# Patient Record
Sex: Male | Born: 1990 | Race: Black or African American | Hispanic: No | Marital: Single | Smoking: Current every day smoker
Health system: Southern US, Community
[De-identification: ages and names within clinical notes are randomized; demographics above are authoritative.]

## PROBLEM LIST (undated history)

## (undated) ENCOUNTER — Emergency Department: Admission: EM | Payer: Self-pay

## (undated) DIAGNOSIS — K219 Gastro-esophageal reflux disease without esophagitis: Secondary | ICD-10-CM

## (undated) DIAGNOSIS — J45909 Unspecified asthma, uncomplicated: Secondary | ICD-10-CM

## (undated) HISTORY — DX: Unspecified asthma, uncomplicated: J45.909

## (undated) HISTORY — DX: Gastro-esophageal reflux disease without esophagitis: K21.9

## (undated) HISTORY — PX: KNEE SURGERY: SHX244

---
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
## 2011-08-19 ENCOUNTER — Emergency Department: Payer: Self-pay | Admitting: Emergency Medicine

## 2011-09-02 ENCOUNTER — Emergency Department: Payer: Self-pay | Admitting: Unknown Physician Specialty

## 2013-08-20 ENCOUNTER — Emergency Department: Payer: Self-pay | Admitting: Emergency Medicine

## 2014-05-28 ENCOUNTER — Emergency Department: Payer: Self-pay | Admitting: Emergency Medicine

## 2015-01-31 IMAGING — CT CT MAXILLOFACIAL WITHOUT CONTRAST
4 series · 17 of 47 positions shown, 19 images · non-contrast
Comparison: None.

CLINICAL DATA: Head trauma, softball injury. Neck and facial
soreness.

EXAM:
CT HEAD WITHOUT CONTRAST
CT MAXILLOFACIAL WITHOUT CONTRAST
TECHNIQUE: Multidetector CT imaging of the head and maxillofacial structures
were performed using the standard protocol without intravenous
contrast. Multiplanar CT image reconstructions of the maxillofacial
structures were also generated.

[Series 2: head wo · axial · 0.43mm/px · z∈[-103,+23]mm · 7 of 36 slices shown, 9 images]
[im 5/36  brain]
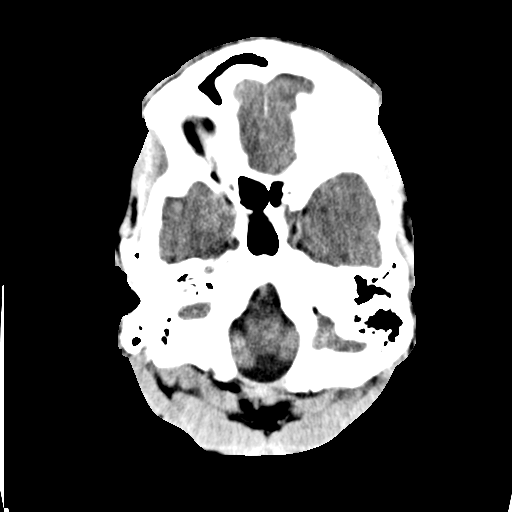
[im 5/36  bone]
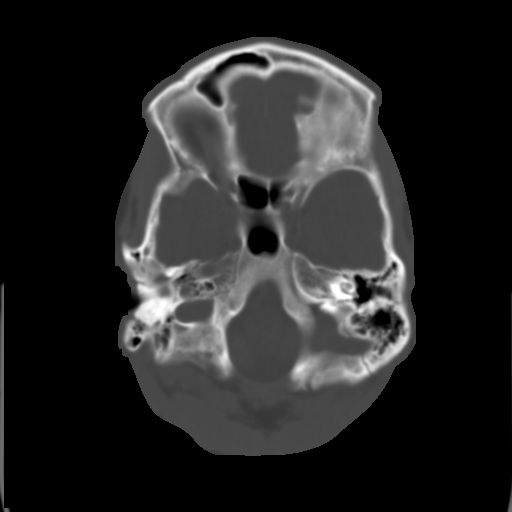
[im 9/36  bone]
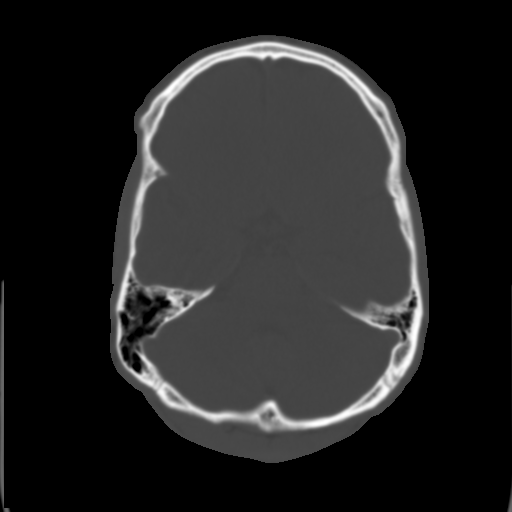
[im 14/36  bone]
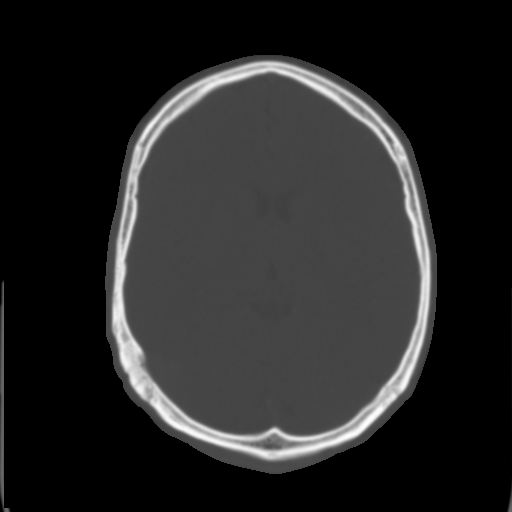
[im 18/36  bone]
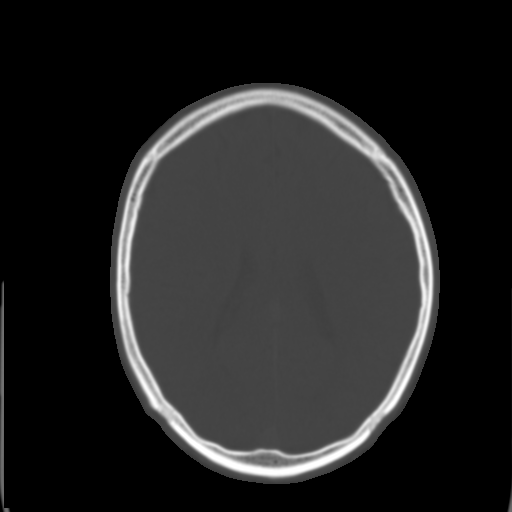
[im 22/36  brain]
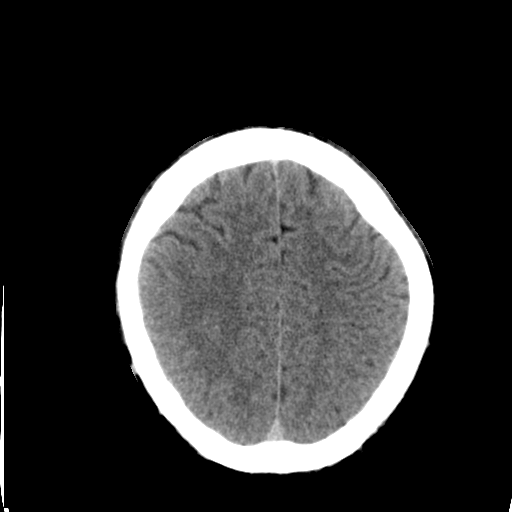
[im 22/36  bone]
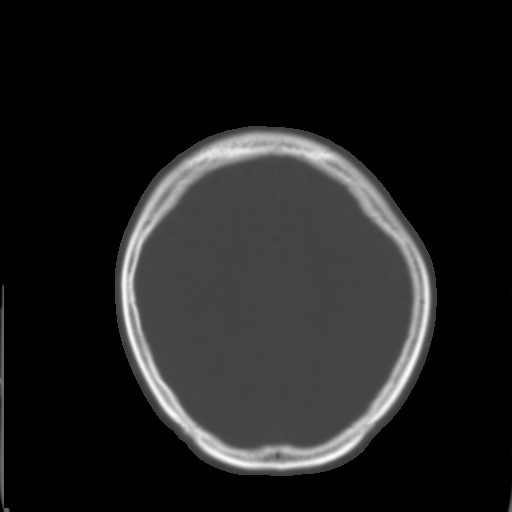
[im 27/36  bone]
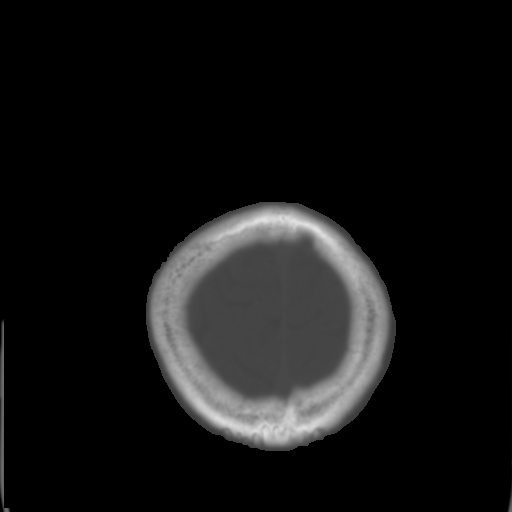
[im 31/36  bone]
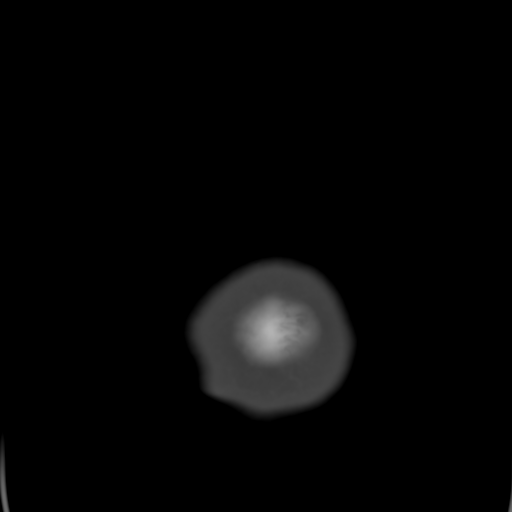

[Series 3: max soft · axial · 0.32mm/px · z∈[-230,-172]mm · 4 of 84 slices shown]
[im 9/84  brain]
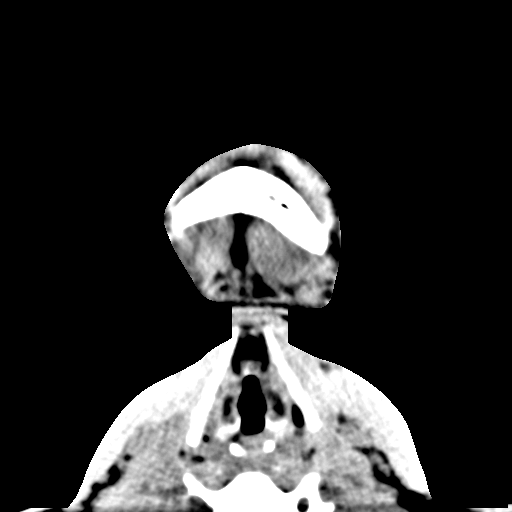
[im 17/84  brain]
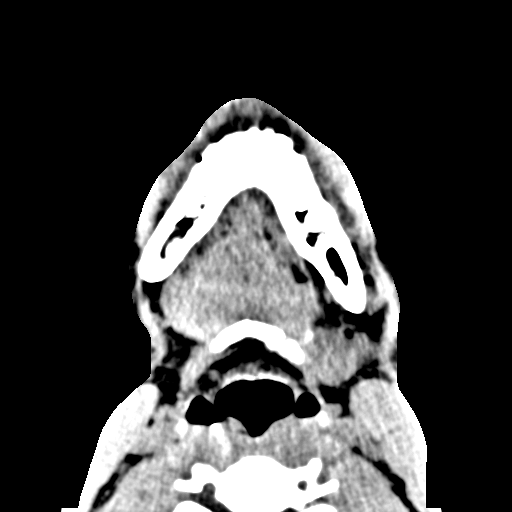
[im 25/84  brain]
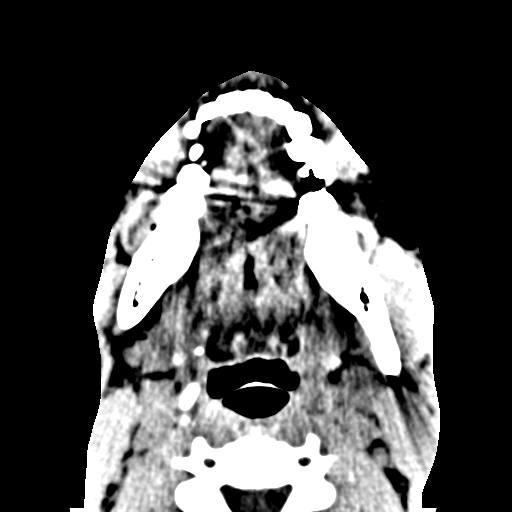
[im 38/84  brain]
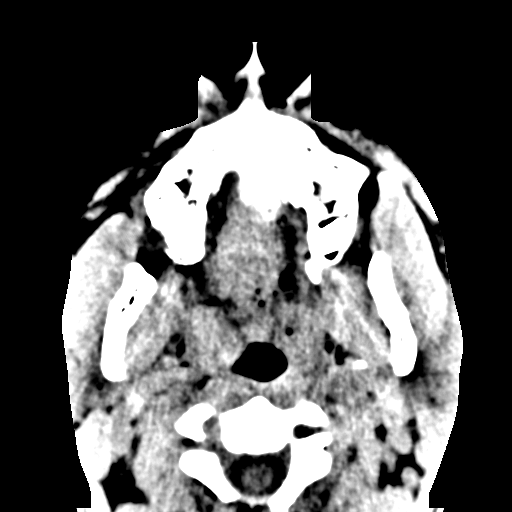

[Series 6: coronal soft · coronal · 0.31mm/px · 3 of 79 slices shown]
[im 27/79  bone]
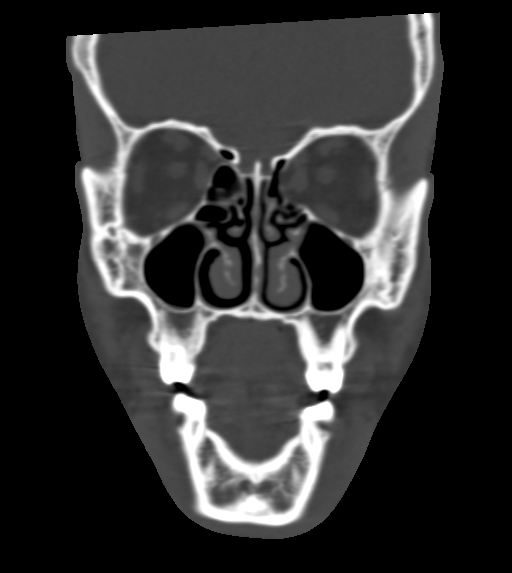
[im 35/79  bone]
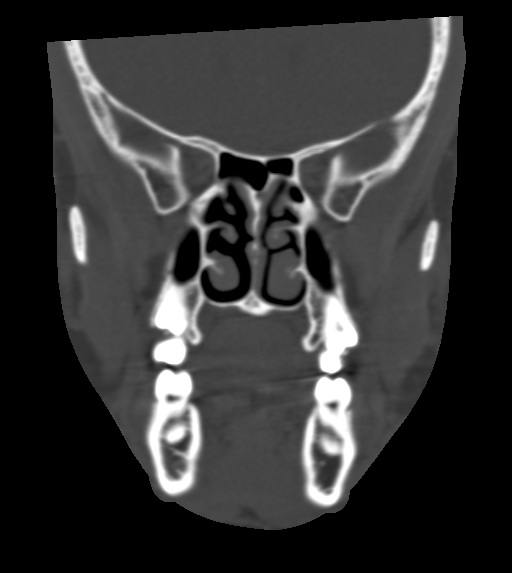
[im 44/79  bone]
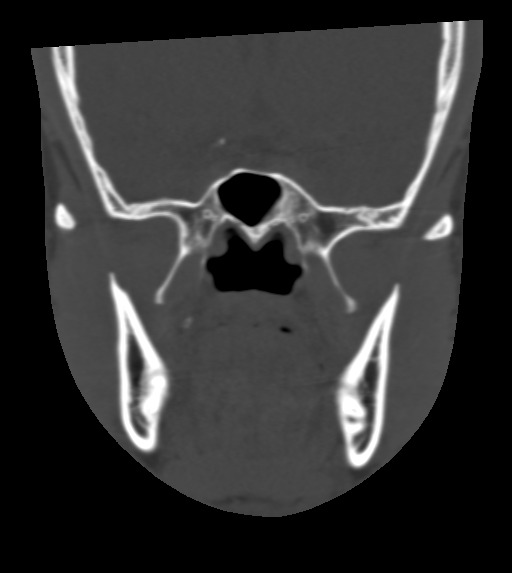

[Series 7: sagittal soft · sagittal · 0.35mm/px · 3 of 80 slices shown]
[im 27/80  bone]
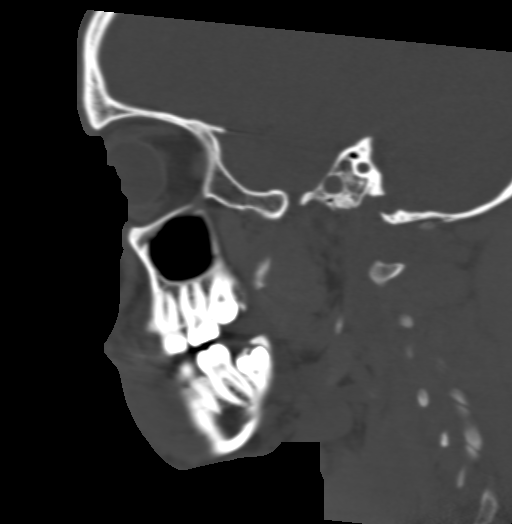
[im 40/80  bone]
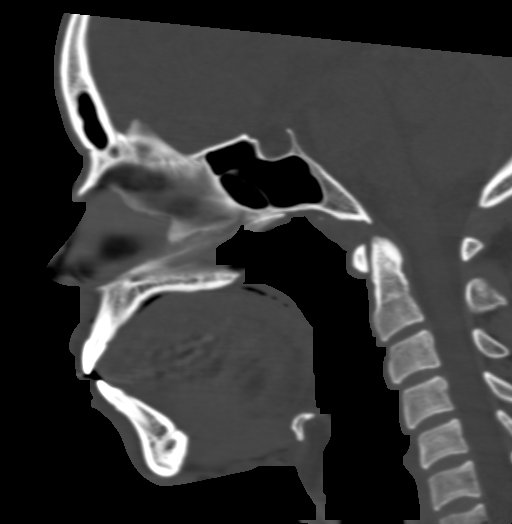
[im 53/80  bone]
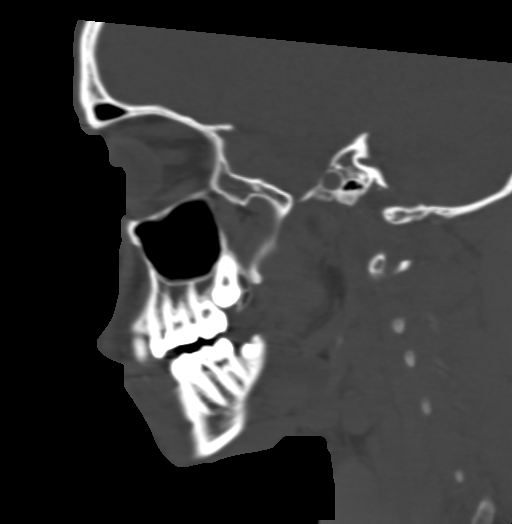

[17 of 47 positions shown; findings below may reference images not displayed]

FINDINGS: CT HEAD FINDINGS

The ventricles and sulci are normal. No intraparenchymal hemorrhage,
mass effect nor midline shift. No acute large vascular territory
infarcts.

No abnormal extra-axial fluid collections. Basal cisterns are
patent. No skull fracture.

CT MAXILLOFACIAL FINDINGS

The mandible is intact, the condyles are located. No acute facial
fracture.

Trace paranasal sinus mucosal thickening without air-fluid levels.
Nasal septum is midline. No destructive bony lesions.

Remote LEFT medial orbital blowout fracture. Soft tissues are
nonsuspicious.
IMPRESSION: CT HEAD: No acute intracranial process ; normal noncontrast CT of
the head.

CT MAXILLOFACIAL:  No acute facial fracture.

Remote LEFT medial orbital blowout fracture.

  By: Abudullahi Dammie

## 2015-05-01 ENCOUNTER — Emergency Department
Admission: EM | Admit: 2015-05-01 | Discharge: 2015-05-01 | Disposition: A | Discharge status: Home / Self Care | Payer: BLUE CROSS/BLUE SHIELD | Attending: Emergency Medicine | Admitting: Emergency Medicine

## 2015-05-01 ENCOUNTER — Encounter: Payer: Self-pay | Admitting: *Deleted

## 2015-05-01 DIAGNOSIS — Z720 Tobacco use: Secondary | ICD-10-CM | POA: Insufficient documentation

## 2015-05-01 DIAGNOSIS — K2970 Gastritis, unspecified, without bleeding: Secondary | ICD-10-CM | POA: Diagnosis not present

## 2015-05-01 DIAGNOSIS — R1010 Upper abdominal pain, unspecified: Secondary | ICD-10-CM | POA: Diagnosis present

## 2015-05-01 LAB — COMPREHENSIVE METABOLIC PANEL
ALK PHOS: 32 U/L — AB (ref 38–126)
ALT: 11 U/L — AB (ref 17–63)
AST: 20 U/L (ref 15–41)
Albumin: 4.1 g/dL (ref 3.5–5.0)
Anion gap: 8 (ref 5–15)
BUN: 6 mg/dL (ref 6–20)
CALCIUM: 9.2 mg/dL (ref 8.9–10.3)
CHLORIDE: 106 mmol/L (ref 101–111)
CO2: 27 mmol/L (ref 22–32)
CREATININE: 1.19 mg/dL (ref 0.61–1.24)
Glucose, Bld: 118 mg/dL — ABNORMAL HIGH (ref 65–99)
Potassium: 3.3 mmol/L — ABNORMAL LOW (ref 3.5–5.1)
Sodium: 141 mmol/L (ref 135–145)
TOTAL PROTEIN: 7.2 g/dL (ref 6.5–8.1)
Total Bilirubin: 0.3 mg/dL (ref 0.3–1.2)

## 2015-05-01 LAB — URINALYSIS COMPLETE WITH MICROSCOPIC (ARMC ONLY)
BILIRUBIN URINE: NEGATIVE
Bacteria, UA: NONE SEEN
Glucose, UA: NEGATIVE mg/dL
HGB URINE DIPSTICK: NEGATIVE
KETONES UR: NEGATIVE mg/dL
LEUKOCYTES UA: NEGATIVE
NITRITE: NEGATIVE
PH: 6 (ref 5.0–8.0)
Protein, ur: NEGATIVE mg/dL
Specific Gravity, Urine: 1.01 (ref 1.005–1.030)

## 2015-05-01 LAB — CBC
HCT: 45 % (ref 40.0–52.0)
Hemoglobin: 15.2 g/dL (ref 13.0–18.0)
MCH: 30.8 pg (ref 26.0–34.0)
MCHC: 33.7 g/dL (ref 32.0–36.0)
MCV: 91.3 fL (ref 80.0–100.0)
PLATELETS: 211 10*3/uL (ref 150–440)
RBC: 4.93 MIL/uL (ref 4.40–5.90)
RDW: 13.1 % (ref 11.5–14.5)
WBC: 9.1 10*3/uL (ref 3.8–10.6)

## 2015-05-01 LAB — LIPASE, BLOOD: LIPASE: 19 U/L — AB (ref 22–51)

## 2015-05-01 MED ORDER — RANITIDINE HCL 300 MG PO TABS: 300.0000 mg | ORAL_TABLET | Freq: Two times a day (BID) | ORAL | Status: DC

## 2015-05-01 MED ORDER — SUCRALFATE 1 G PO TABS: 1.0000 g | ORAL_TABLET | Freq: Four times a day (QID) | ORAL | Status: DC

## 2015-05-01 MED ORDER — GI COCKTAIL ~~LOC~~
30.0000 mL | ORAL | Status: AC
  Administered 2015-05-01: 30 mL via ORAL
  Filled 2015-05-01: qty 30

## 2015-05-01 MED ORDER — FAMOTIDINE 20 MG PO TABS
40.0000 mg | ORAL_TABLET | Freq: Once | ORAL | Status: AC
  Administered 2015-05-01: 40 mg via ORAL
  Filled 2015-05-01: qty 2

## 2015-05-01 NOTE — ED Notes (Signed)
Pt attempted to give urine sample but unsuccessful. Pt has specimen cup

## 2015-05-01 NOTE — ED Provider Notes (Addendum)
[facility] Emergency Department Provider Note  ____________________________________________  Time seen: 9:00 PM  I have reviewed the triage vital signs and the nursing notes.   HISTORY  Chief Complaint Abdominal Pain and Gastroesophageal Reflux    HPI Travis Ponce is a 24 y.o. male who reports upper abdominal pain and acid reflux for several years that is normally controlled with Tums. He states that over the last week it's been progressively worsening and not controlled with Tums anymore. He has episodes of severe epigastric pain that radiates to the back and interfere with his daily life. No trauma. Normal oral intake. No chest pain shortness of breath fevers or chills. No syncope.     Past Medical History  Diagnosis Date  . Reflux      There are no active problems to display for this patient.    Past Surgical History  Procedure Laterality Date  . Knee surgery       Current Outpatient Rx  Name  Route  Sig  Dispense  Refill  . ranitidine (ZANTAC) 300 MG tablet   Oral   Take 1 tablet (300 mg total) by mouth 2 (two) times daily.   60 tablet   0   . sucralfate (CARAFATE) 1 G tablet   Oral   Take 1 tablet (1 g total) by mouth 4 (four) times daily.   120 tablet   1    prescribed today by me   Allergies Review of patient's allergies indicates no known allergies.   No family history on file.  Social History Social History  Substance Use Topics  . Smoking status: Current Every Day Smoker  . Smokeless tobacco: None  . Alcohol Use: Yes    Review of Systems  Constitutional:   No fever or chills. No weight changes Eyes:   No blurry vision or double vision.  ENT:   No sore throat. Cardiovascular:   No chest pain. Respiratory:   No dyspnea or cough. Gastrointestinal:   Positive epigastric pain without, vomiting and diarrhea.  No BRBPR or melena. Genitourinary:   Negative for dysuria, urinary retention, bloody urine,  or difficulty urinating. Musculoskeletal:   Negative for back pain. No joint swelling or pain. Skin:   Negative for rash. Neurological:   Negative for headaches, focal weakness or numbness. Psychiatric:  No anxiety or depression.   Endocrine:  No hot/cold intolerance, changes in energy, or sleep difficulty.  10-point ROS otherwise negative.  ____________________________________________   PHYSICAL EXAM:  VITAL SIGNS: ED Triage Vitals  Enc Vitals Group     BP 05/01/15 1917 119/56 mmHg     Pulse Rate 05/01/15 1917 69     Resp 05/01/15 1917 16     Temp 05/01/15 1917 98.9 F (37.2 C)     Temp Source 05/01/15 1917 Oral     SpO2 05/01/15 1917 98 %     Weight 05/01/15 1917 140 lb (63.504 kg)     Height 05/01/15 1917 5\' 5"  (1.651 m)     Head Cir --      Peak Flow --      Pain Score 05/01/15 1917 7     Pain Loc --      Pain Edu? --      Excl. in GC? --      Constitutional:   Alert and oriented. Well appearing and in no distress. Eyes:   No scleral icterus. No conjunctival pallor. PERRL. EOMI ENT   Head:   Normocephalic and  atraumatic.   Nose:   No congestion/rhinnorhea. No septal hematoma   Mouth/Throat:   MMM, no pharyngeal erythema. No peritonsillar mass. No uvula shift.   Neck:   No stridor. No SubQ emphysema. No meningismus. Hematological/Lymphatic/Immunilogical:   No cervical lymphadenopathy. Cardiovascular:   RRR. Normal and symmetric distal pulses are present in all extremities. No murmurs, rubs, or gallops. Respiratory:   Normal respiratory effort without tachypnea nor retractions. Breath sounds are clear and equal bilaterally. No wheezes/rales/rhonchi. Gastrointestinal:   Soft with left upper quadrant tenderness. No distention. There is no CVA tenderness.  No rebound, rigidity, or guarding. Genitourinary:   deferred Musculoskeletal:   Nontender with normal range of motion in all extremities. No joint effusions.  No lower extremity tenderness.  No  edema. Neurologic:   Normal speech and language.  CN 2-10 normal. Motor grossly intact. No pronator drift.  Normal gait. No gross focal neurologic deficits are appreciated.  Skin:    Skin is warm, dry and intact. No rash noted.  No petechiae, purpura, or bullae. Psychiatric:   Mood and affect are normal. Speech and behavior are normal. Patient exhibits appropriate insight and judgment.  ____________________________________________    LABS (pertinent positives/negatives) (all labs ordered are listed, but only abnormal results are displayed) Labs Reviewed  LIPASE, BLOOD - Abnormal; Notable for the following:    Lipase 19 (*)    All other components within normal limits  COMPREHENSIVE METABOLIC PANEL - Abnormal; Notable for the following:    Potassium 3.3 (*)    Glucose, Bld 118 (*)    ALT 11 (*)    Alkaline Phosphatase 32 (*)    All other components within normal limits  URINALYSIS COMPLETEWITH MICROSCOPIC ([facility] ONLY) - Abnormal; Notable for the following:    Color, Urine YELLOW (*)    APPearance CLEAR (*)    Squamous Epithelial / LPF 0-5 (*)    All other components within normal limits  CBC   ____________________________________________   EKG  Interpreted by me Normal sinus rhythm rate of 66, normal axis intervals. Incomplete right bundle-branch block. Normal ST segments and T waves.  ____________________________________________    RADIOLOGY    ____________________________________________   PROCEDURES   ____________________________________________   INITIAL IMPRESSION / ASSESSMENT AND PLAN / ED COURSE  Pertinent labs & imaging results that were available during my care of the patient were reviewed by me and considered in my medical decision making (see chart for details).  Patient presents with upper abdominal pain and history consistent with gastritis. Labs unremarkable, exam is not concerning for pancreatitis or cholecystitis or tenderness or bowel  perforation or obstruction. Vital signs are normal. We'll start a course of daily antacids for symptom relief, with follow-up with primary care and/or gastroenterology if symptoms do not resolve. He also reports his stools have become dark ever since he started drinking lots of Pepto-Bismol and an attempt to resolve his pain. This is an expected side effect of Pepto-Bismol. Low suspicion for GI bleeding.     ____________________________________________   FINAL CLINICAL IMPRESSION(S) / ED DIAGNOSES  Final diagnoses:  Gastritis      Sharman Cheek, MD 05/01/15 2115  Sharman Cheek, MD 05/01/15 2116

## 2015-05-01 NOTE — Discharge Instructions (Signed)

## 2015-05-01 NOTE — ED Notes (Signed)
Pt reports abdominal pain/reflux for several years, but states "I can't handle it anymore". Past three days stools have been normal consistency, but black in appearance. Pain in epigastric region. Does not take rx medicine for reflux.

## 2016-01-25 ENCOUNTER — Encounter: Payer: Self-pay | Admitting: Family Medicine

## 2016-01-25 ENCOUNTER — Ambulatory Visit (INDEPENDENT_AMBULATORY_CARE_PROVIDER_SITE_OTHER): Payer: BLUE CROSS/BLUE SHIELD | Admitting: Family Medicine

## 2016-01-25 VITALS — BP 116/70 | HR 71 | Temp 98.3°F | Wt 137.5 lb

## 2016-01-25 DIAGNOSIS — R55 Syncope and collapse: Secondary | ICD-10-CM | POA: Diagnosis not present

## 2016-01-25 LAB — LIPID PANEL
CHOL/HDL RATIO: 4
Cholesterol: 182 mg/dL (ref 0–200)
HDL: 47 mg/dL (ref >39.00)
LDL CALC: 123 mg/dL — AB (ref 0–99)
NONHDL: 135.44
Triglycerides: 60 mg/dL (ref 0.0–149.0)
VLDL: 12 mg/dL (ref 0.0–40.0)

## 2016-01-25 LAB — COMPREHENSIVE METABOLIC PANEL
ALT: 11 U/L (ref 0–53)
AST: 21 U/L (ref 0–37)
Albumin: 4.5 g/dL (ref 3.5–5.2)
Alkaline Phosphatase: 31 U/L — ABNORMAL LOW (ref 39–117)
BUN: 13 mg/dL (ref 6–23)
CHLORIDE: 103 meq/L (ref 96–112)
CO2: 28 meq/L (ref 19–32)
Calcium: 9.9 mg/dL (ref 8.4–10.5)
Creatinine, Ser: 1.19 mg/dL (ref 0.40–1.50)
GFR: 95.15 mL/min (ref >60.00)
GLUCOSE: 89 mg/dL (ref 70–99)
POTASSIUM: 4.8 meq/L (ref 3.5–5.1)
SODIUM: 137 meq/L (ref 135–145)
TOTAL PROTEIN: 7.8 g/dL (ref 6.0–8.3)
Total Bilirubin: 0.4 mg/dL (ref 0.2–1.2)

## 2016-01-25 LAB — CBC
HEMATOCRIT: 49.1 % (ref 39.0–52.0)
Hemoglobin: 16.2 g/dL (ref 13.0–17.0)
MCHC: 32.9 g/dL (ref 30.0–36.0)
MCV: 91.2 fl (ref 78.0–100.0)
Platelets: 236 10*3/uL (ref 150.0–400.0)
RBC: 5.38 Mil/uL (ref 4.22–5.81)
RDW: 12.9 % (ref 11.5–15.5)
WBC: 6.9 10*3/uL (ref 4.0–10.5)

## 2016-01-25 LAB — HEMOGLOBIN A1C: HEMOGLOBIN A1C: 5.6 % (ref 4.6–6.5)

## 2016-01-25 LAB — TSH: TSH: 0.95 u[IU]/mL (ref 0.35–4.50)

## 2016-01-25 NOTE — Progress Notes (Signed)
Pre visit review using our clinic review tool, if applicable. No additional management support is needed unless otherwise documented below in the visit note. 

## 2016-01-25 NOTE — Assessment & Plan Note (Signed)
New problem with uncertain diagnosis and prognosis at this time. Concern for arrhythmia given history. Obtaining lab work today. See orders. Sending to cardiology for further evaluation/consideration for event monitor.

## 2016-01-25 NOTE — Patient Instructions (Signed)
We will call with your referral and with your lab results.  No strenuous activities.  Take care  Dr. Adriana Simasook

## 2016-01-25 NOTE — Progress Notes (Signed)
Subjective:  Patient ID: Travis Ponce, male    DOB: [birth year]  Age: 25 y.o. MRN: [id number]  CC: Dizziness  HPI Travis Ponce is a 25 y.o. male presents to the clinic today as a new patient with the above complaint.  Dizziness  Patient reports that he's had intermittent dizziness for the past 3 weeks.  He states that it occurs up to 2-3 times a day but is not always daily.  He states that it lasts seconds to minutes.  No association with exertion.  He reports that it feels as if he is going to pass out.  He reports associated nausea, sweatiness/diaphoresis. He also feels like his heart is racing when it occurs.  No chest pain. No shortness of breath.  He's able to exercise/be physically active without difficulty.  No reports of polyuria or polydipsia.  Resolve spontaneously. No known relieving factors. No known exacerbating factors.  PMH, Surgical Hx, Family Hx, Social History reviewed and updated as below.  Past Medical History  Diagnosis Date  . GERD (gastroesophageal reflux disease)   . Asthma     Reported history of asthma   Past Surgical History  Procedure Laterality Date  . Knee surgery Left     ACL repair x 2 and meniscal repair   Family History  Problem Relation Age of Onset  . Colon cancer Mother    Social History  Substance Use Topics  . Smoking status: Current Every Day Smoker -- 0.50 packs/day    Types: Cigarettes  . Smokeless tobacco: Not on file  . Alcohol Use: 0.0 oz/week    0 Standard drinks or equivalent per week   Review of Systems  Constitutional: Positive for diaphoresis.  Cardiovascular: Positive for palpitations.  Neurological: Positive for dizziness.  All other systems reviewed and are negative.  Objective:   Today's Vitals: BP 116/70 mmHg  Pulse 71  Temp(Src) 98.3 F (36.8 C) (Oral)  Wt 137 lb 8 oz (62.37 kg)  SpO2 98%  Physical Exam  Constitutional: He is oriented to person, place, and time. He  appears well-developed and well-nourished. No distress.  HENT:  Head: Normocephalic and atraumatic.  Nose: Nose normal.  Mouth/Throat: Oropharynx is clear and moist. No oropharyngeal exudate.  Normal TM's bilaterally.   Eyes: Conjunctivae are normal. No scleral icterus.  Neck: Neck supple. No thyromegaly present.  Cardiovascular: Normal rate and regular rhythm.   No murmur heard. Pulmonary/Chest: Effort normal and breath sounds normal. He has no wheezes. He has no rales.  Abdominal: Soft. He exhibits no distension. There is no tenderness. There is no rebound and no guarding.  Musculoskeletal: Normal range of motion. He exhibits no edema.  Lymphadenopathy:    He has no cervical adenopathy.  Neurological: He is alert and oriented to person, place, and time.  Skin: Skin is warm and dry. No rash noted.  Psychiatric: He has a normal mood and affect.  Vitals reviewed.  Assessment & Plan:   Problem List Items Addressed This Visit    Pre-syncope - Primary    New problem with uncertain diagnosis and prognosis at this time. Concern for arrhythmia given history. Obtaining lab work today. See orders. Sending to cardiology for further evaluation/consideration for event monitor.      Relevant Orders   CBC   Comp Met (CMET)   Lipid panel   HgB A1c   TSH      Outpatient Encounter Prescriptions as of 01/25/2016  Medication Sig  . ranitidine (ZANTAC)  300 MG tablet Take 1 tablet (300 mg total) by mouth 2 (two) times daily.  . [DISCONTINUED] sucralfate (CARAFATE) 1 G tablet Take 1 tablet (1 g total) by mouth 4 (four) times daily.   No facility-administered encounter medications on file as of 01/25/2016.   Follow-up: Pending cardiology eval  Travis Ponce

## 2016-02-11 ENCOUNTER — Ambulatory Visit: Payer: BLUE CROSS/BLUE SHIELD | Admitting: Family Medicine

## 2016-02-11 DIAGNOSIS — Z0289 Encounter for other administrative examinations: Secondary | ICD-10-CM

## 2018-09-16 ENCOUNTER — Other Ambulatory Visit: Payer: Self-pay

## 2018-09-16 ENCOUNTER — Emergency Department
Admission: EM | Admit: 2018-09-16 | Discharge: 2018-09-16 | Disposition: A | Discharge status: Home / Self Care | Payer: BLUE CROSS/BLUE SHIELD | Attending: Emergency Medicine | Admitting: Emergency Medicine

## 2018-09-16 ENCOUNTER — Encounter: Payer: Self-pay | Admitting: Emergency Medicine

## 2018-09-16 DIAGNOSIS — J111 Influenza due to unidentified influenza virus with other respiratory manifestations: Secondary | ICD-10-CM | POA: Insufficient documentation

## 2018-09-16 DIAGNOSIS — J45909 Unspecified asthma, uncomplicated: Secondary | ICD-10-CM | POA: Insufficient documentation

## 2018-09-16 DIAGNOSIS — F17210 Nicotine dependence, cigarettes, uncomplicated: Secondary | ICD-10-CM | POA: Insufficient documentation

## 2018-09-16 MED ORDER — OSELTAMIVIR PHOSPHATE 75 MG PO CAPS: 75.0000 mg | ORAL_CAPSULE | Freq: Two times a day (BID) | ORAL | 0 refills | Status: AC

## 2018-09-16 MED ORDER — ALBUTEROL SULFATE HFA 108 (90 BASE) MCG/ACT IN AERS: 2.0000 | INHALATION_SPRAY | Freq: Four times a day (QID) | RESPIRATORY_TRACT | 2 refills | Status: AC | PRN

## 2018-09-16 NOTE — Discharge Instructions (Addendum)
Follow-up with your primary care provider if any continued problems.  Increase fluids. Begin taking Tamiflu twice a day for the next 5 days.  You can continue taking Tylenol or ibuprofen as needed.  Also you can continue taking your over-the-counter cough medication as needed for your symptoms.

## 2018-09-16 NOTE — ED Triage Notes (Signed)
Pt c/o flu like symptoms with body aches xfew days. NAD noted

## 2018-09-16 NOTE — ED Notes (Signed)

## 2018-09-16 NOTE — ED Provider Notes (Signed)
[facility] Emergency Department Provider Note  ____________________________________________   First MD Initiated Contact with Patient 09/16/18 1258     (approximate)  I have reviewed the triage vital signs and the nursing notes.   HISTORY  Chief Complaint No chief complaint on file.   HPI Travis Ponce is a 28 y.o. male presents to the ED with complaint of sudden onset of muscle aches, fever, chills and cough 1 1/2 days ago.  Patient states that he was at work when symptoms started.  He has taken over-the-counter medication with some relief.  He states that he did not get the flu vaccine.  He denies any nausea, vomiting or diarrhea.  Rates pain as an 8/10.     Past Medical History:  Diagnosis Date  . Asthma    Reported history of asthma  . GERD (gastroesophageal reflux disease)     Patient Active Problem List   Diagnosis Date Noted  . Pre-syncope 01/25/2016    Past Surgical History:  Procedure Laterality Date  . KNEE SURGERY Left    ACL repair x 2 and meniscal repair    Prior to Admission medications   Medication Sig Start Date End Date Taking? Authorizing Provider  albuterol (PROVENTIL HFA;VENTOLIN HFA) 108 (90 Base) MCG/ACT inhaler Inhale 2 puffs into the lungs every 6 (six) hours as needed for wheezing or shortness of breath. 09/16/18   Tommi Rumps, PA-C  oseltamivir (TAMIFLU) 75 MG capsule Take 1 capsule (75 mg total) by mouth 2 (two) times daily for 5 days. 09/16/18 09/21/18  Tommi Rumps, PA-C  ranitidine (ZANTAC) 300 MG tablet Take 1 tablet (300 mg total) by mouth 2 (two) times daily. 05/01/15 04/30/16  Sharman Cheek, MD    Allergies Patient has no known allergies.  Family History  Problem Relation Age of Onset  . Colon cancer Mother     Social History Social History   Tobacco Use  . Smoking status: Current Every Day Smoker    Packs/day: 0.50    Types: Cigarettes  Substance Use Topics  . Alcohol use:  Yes    Alcohol/week: 0.0 standard drinks  . Drug use: No    Review of Systems Constitutional: Subjective fever/chills Eyes: No visual changes. ENT: No sore throat. Cardiovascular: Denies chest pain. Respiratory: Denies shortness of breath.  Positive cough. Gastrointestinal: No abdominal pain.  No nausea, no vomiting.  Musculoskeletal: Positive for body aches. Skin: Negative for rash. Neurological: Negative for headaches, focal weakness or numbness. ___________________________________________   PHYSICAL EXAM:  VITAL SIGNS: ED Triage Vitals  Enc Vitals Group     BP 09/16/18 1200 124/71     Pulse Rate 09/16/18 1200 61     Resp 09/16/18 1200 16     Temp 09/16/18 1200 98.3 F (36.8 C)     Temp Source 09/16/18 1200 Oral     SpO2 09/16/18 1200 97 %     Weight --      Height --      Head Circumference --      Peak Flow --      Pain Score 09/16/18 1159 8     Pain Loc --      Pain Edu? --      Excl. in GC? --    Constitutional: Alert and oriented. Well appearing and in no acute distress. Eyes: Conjunctivae are normal.  Head: Atraumatic. Nose: Mild congestion/rhinnorhea. Mouth/Throat: Mucous membranes are moist.  Oropharynx non-erythematous. Neck: No stridor.   Hematological/Lymphatic/Immunilogical:  No cervical lymphadenopathy. Cardiovascular: Normal rate, regular rhythm. Grossly normal heart sounds.  Good peripheral circulation. Respiratory: Normal respiratory effort.  No retractions. Lungs CTAB. Gastrointestinal: Soft and nontender. No distention. Musculoskeletal: Moves upper lower extremities without any difficulty.  Normal gait was noted. Neurologic:  Normal speech and language. No gross focal neurologic deficits are appreciated. No gait instability. Skin:  Skin is warm, dry and intact. No rash noted. Psychiatric: Mood and affect are normal. Speech and behavior are normal.  ____________________________________________   LABS (all labs ordered are listed, but only  abnormal results are displayed)  Labs Reviewed - No data to display   PROCEDURES  Procedure(s) performed (including Critical Care):  Procedures   ____________________________________________   INITIAL IMPRESSION / ASSESSMENT AND PLAN / ED COURSE  As part of my medical decision making, I reviewed the following data within the electronic MEDICAL RECORD NUMBER Notes from prior ED visits and [place] Controlled Substance Database  Patient presents to the ED with complaint of sudden onset of upper respiratory symptoms along with body aches and subjective fever 1 1/2 days ago.  He has taken over-the-counter medication with some minimal relief of his symptoms.  Physical exam is unremarkable with the exception of some upper respiratory symptoms.  Patient is agreeable and wants to take the Tamiflu that was prescribed for him and sent to his pharmacy.  He also requested an albuterol inhaler as he is out at home.  He is encouraged to continue with ibuprofen and Tylenol as needed for fever and increase his fluid intake to prevent dehydration as his appetite has decreased. ____________________________________________   FINAL CLINICAL IMPRESSION(S) / ED DIAGNOSES  Final diagnoses:  Influenza     ED Discharge Orders         Ordered    oseltamivir (TAMIFLU) 75 MG capsule  2 times daily     09/16/18 1342    albuterol (PROVENTIL HFA;VENTOLIN HFA) 108 (90 Base) MCG/ACT inhaler  Every 6 hours PRN     09/16/18 1359           Note:  This document was prepared using Dragon voice recognition software and may include unintentional dictation errors.    Tommi Rumps, PA-C 09/16/18 1405    Arnaldo Natal, MD 09/16/18 [phone]

## 2019-08-29 ENCOUNTER — Other Ambulatory Visit: Payer: Self-pay

## 2019-08-29 ENCOUNTER — Ambulatory Visit: Payer: Self-pay | Admitting: Family Medicine

## 2019-08-29 ENCOUNTER — Encounter: Payer: Self-pay | Admitting: Family Medicine

## 2019-08-29 DIAGNOSIS — Z202 Contact with and (suspected) exposure to infections with a predominantly sexual mode of transmission: Secondary | ICD-10-CM

## 2019-08-29 DIAGNOSIS — J45909 Unspecified asthma, uncomplicated: Secondary | ICD-10-CM | POA: Insufficient documentation

## 2019-08-29 DIAGNOSIS — Z113 Encounter for screening for infections with a predominantly sexual mode of transmission: Secondary | ICD-10-CM

## 2019-08-29 LAB — GRAM STAIN

## 2019-08-29 MED ORDER — CEFTRIAXONE SODIUM 500 MG IJ SOLR
500.0000 mg | Freq: Once | INTRAMUSCULAR | Status: AC
  Administered 2019-08-29: 15:00:00 500 mg via INTRAMUSCULAR

## 2019-08-29 NOTE — Progress Notes (Addendum)
Patient here for STD screening and needs treatment as contact to gonorrhea. Partner was treated on 08/25/2019.Marland KitchenMarland KitchenBurt Knack, RN

## 2019-08-29 NOTE — Progress Notes (Signed)
[facility] Department STI clinic/screening visit  Subjective:  Travis Ponce is a 29 y.o. male being seen today for  Chief Complaint  Patient presents with  . Exposure to STD     The patient reports they do not have symptoms.   Patient has the following medical conditions:   Patient Active Problem List   Diagnosis Date Noted  . Asthma 08/29/2019  . Pre-syncope 01/25/2016    HPI  Pt reports he is a contact to gonorrhea, partner tested positive for only gonorrhea. He denies symptoms aside from sore throat x3-4 wks.   See flowsheet for further details and programmatic requirements.     No components found for: HCV  The following portions of the patient's history were reviewed and updated as appropriate: allergies, current medications, past medical history, past social history, past surgical history and problem list.  Objective:  There were no vitals filed for this visit.   Physical Exam Constitutional:      Appearance: Normal appearance.  HENT:     Head: Normocephalic and atraumatic.     Comments: No nits or hair loss    Mouth/Throat:     Mouth: Mucous membranes are moist.     Pharynx: Oropharynx is clear. No oropharyngeal exudate or posterior oropharyngeal erythema.  Pulmonary:     Effort: Pulmonary effort is normal.  Abdominal:     General: Abdomen is flat.     Palpations: Abdomen is soft. There is no hepatomegaly or mass.     Tenderness: There is no abdominal tenderness.  Genitourinary:    Pubic Area: No rash or pubic lice.      Penis: Normal.      Testes: Normal.     Epididymis:     Right: Normal.     Left: Normal.     Rectum: Normal.  Lymphadenopathy:     Head:     Right side of head: No preauricular or posterior auricular adenopathy.     Left side of head: No preauricular or posterior auricular adenopathy.     Cervical: No cervical adenopathy.     Upper Body:     Right upper body: No supraclavicular or axillary adenopathy.   Left upper body: No supraclavicular or axillary adenopathy.     Lower Body: No right inguinal adenopathy. No left inguinal adenopathy.  Skin:    General: Skin is warm and dry.     Findings: No rash.  Neurological:     Mental Status: He is alert and oriented to person, place, and time.       Assessment and Plan:  Travis Ponce is a 29 y.o. male presenting to the [facility] Department for STI screening    1. Screening examination for venereal disease -Screenings today as below. Treat gram stain per standing order. -Patient does meet criteria for HepB, HepC Screening. Accepts these screenings. -Counseled on warning s/sx and when to seek care. Recommended condom use with all sex and discussed importance of condom use for STI prevention. - Gram stain - HIV/HCV [place] [facility] - HBV Antigen/Antibody [facility] - Syphilis Serology, [place] [facility] - Gonococcus culture - throat - Gonococcus culture - urethra  2. Gonorrhea contact -Treatment today as below as contact to gonorrhea only. -No known allergies to these medications. Pt weight <300 lbs. -Advised no sex for 7 days after both pt and partner completes treatment and encouraged condoms with all sex. - cefTRIAXone (ROCEPHIN) injection 500 mg  Return for screening as needed.  No future appointments.  Ann Held, PA-C

## 2019-08-29 NOTE — Progress Notes (Signed)
Patient gram stain reviewed, no treatment indicated. Patient treated per SO as contact to gonorrhea. Patient given 250mg  ceftriaxone in left deltoid, and 250mg  ceftriaxone in right deltoid as no 500 mg strength currently available. Patient counseled to sit in room for 20 minutes to be observed for possible reaction to medication. Patient stated he was unable to stay for more than 10 minutes do to needing to be at work. He states he just started a new job and doesn't want to be late. Patient counseled to monitor self for breathing issues, facial/tongue swelling, rash and to call 911 if any of these occur. Patient states understanding and left after 10 minutes observation to get to work. , RN

## 2019-09-02 LAB — GONOCOCCUS CULTURE

## 2019-09-02 LAB — HM HIV SCREENING LAB: HM HIV Screening: NEGATIVE

## 2019-09-02 LAB — HM HEPATITIS C SCREENING LAB: HM Hepatitis Screen: NEGATIVE

## 2019-09-05 LAB — HEPATITIS B SURFACE ANTIGEN

## 2020-02-29 ENCOUNTER — Other Ambulatory Visit: Payer: Self-pay

## 2020-02-29 ENCOUNTER — Ambulatory Visit: Payer: Self-pay | Admitting: Physician Assistant

## 2020-02-29 DIAGNOSIS — Z202 Contact with and (suspected) exposure to infections with a predominantly sexual mode of transmission: Secondary | ICD-10-CM

## 2020-02-29 DIAGNOSIS — Z113 Encounter for screening for infections with a predominantly sexual mode of transmission: Secondary | ICD-10-CM

## 2020-02-29 LAB — GRAM STAIN

## 2020-02-29 MED ORDER — METRONIDAZOLE 500 MG PO TABS: 2000.0000 mg | ORAL_TABLET | Freq: Once | ORAL | 0 refills | Status: AC

## 2020-02-29 NOTE — Progress Notes (Signed)
Gram stain reviewed and is negative today, so no treatment needed for gram stain per standing order. Pt treated as a contact to Trich per Sadie Haber, PA order and per standing order. Counseled pt per provider orders and pt states understanding. Provider orders completed.

## 2020-03-01 ENCOUNTER — Encounter: Payer: Self-pay | Admitting: Physician Assistant

## 2020-03-01 NOTE — Progress Notes (Signed)
[facility] Department STI clinic/screening visit  Subjective:  Travis Ponce is a 29 y.o. male being seen today for an STI screening visit. The patient reports they do not have symptoms.    Patient has the following medical conditions:   Patient Active Problem List   Diagnosis Date Noted  . Asthma 08/29/2019  . Pre-syncope 01/25/2016     Chief Complaint  Patient presents with  . SEXUALLY TRANSMITTED DISEASE    screening    HPI  Patient reports that he is not having any symptoms but is a contact to Trich.  States that he takes medicine for GERD and uses an inhaler as needed for asthma.  Reports last HIV test was 2 months ago and that his last void prior to sample collection for Gram stain was over 2 hr ago.    See flowsheet for further details and programmatic requirements.    The following portions of the patient's history were reviewed and updated as appropriate: allergies, current medications, past medical history, past social history, past surgical history and problem list.  Objective:  There were no vitals filed for this visit.  Physical Exam Constitutional:      General: He is not in acute distress.    Appearance: Normal appearance.  HENT:     Head: Normocephalic and atraumatic.     Comments: No nits, lice or hair loss. No cervical, supraclavicular or axillary adenopathy.    Mouth/Throat:     Mouth: Mucous membranes are moist.     Pharynx: Oropharynx is clear. No oropharyngeal exudate or posterior oropharyngeal erythema.  Eyes:     Conjunctiva/sclera: Conjunctivae normal.  Pulmonary:     Effort: Pulmonary effort is normal.  Abdominal:     Palpations: Abdomen is soft. There is no mass.     Tenderness: There is no abdominal tenderness. There is no guarding or rebound.  Genitourinary:    Penis: Normal.      Testes: Normal.     Comments: Pubic area without nits, lice, edema, erythema, lesions and inguinal adenopathy. Penis  circumcised, without rash, lesions and discharge at meatus. Musculoskeletal:     Cervical back: Neck supple. No tenderness.  Skin:    General: Skin is warm and dry.     Findings: No bruising, erythema, lesion or rash.  Neurological:     Mental Status: He is alert and oriented to person, place, and time.  Psychiatric:        Mood and Affect: Mood normal.        Behavior: Behavior normal.        Thought Content: Thought content normal.        Judgment: Judgment normal.       Assessment and Plan:  Travis Ponce is a 29 y.o. male presenting to the [facility] Department for STI screening  1. Screening for STD (sexually transmitted disease) Patient into clinic without symptoms. Rec condoms with all sex. Await test results.  Counseled that RN will call if needs to RTC for treatment once results are back. - Gram stain - Gonococcus culture - HIV [facility] - Syphilis Serology, [facility] - Gonococcus culture  2. Venereal disease contact Treat as a contact to Trich with Metronidazole 2 g po with food, no EtOH for 24 hr before and until 72 hr after completing medicine. No sex for 7 days and until after partner completes treatment. RTC for re-treatment if vomits < 2 hr after taking  medicine.  - metroNIDAZOLE (FLAGYL) 500 MG tablet; Take 4 tablets (2,000 mg total) by mouth once for 1 dose.  Dispense: 4 tablet; Refill: 0     Return for 2-3 weeks for TR's, and PRN.  No future appointments.  Matt Holmes, PA

## 2020-03-05 LAB — GONOCOCCUS CULTURE

## 2020-03-06 ENCOUNTER — Ambulatory Visit
Admission: EM | Admit: 2020-03-06 | Discharge: 2020-03-06 | Disposition: A | Discharge status: Home / Self Care | Payer: Self-pay | Attending: Urgent Care | Admitting: Urgent Care

## 2020-03-06 ENCOUNTER — Other Ambulatory Visit: Payer: Self-pay

## 2020-03-06 DIAGNOSIS — S29012A Strain of muscle and tendon of back wall of thorax, initial encounter: Secondary | ICD-10-CM

## 2020-03-06 DIAGNOSIS — M546 Pain in thoracic spine: Secondary | ICD-10-CM

## 2020-03-06 MED ORDER — NAPROXEN 500 MG PO TABS: 500.0000 mg | ORAL_TABLET | Freq: Two times a day (BID) | ORAL | 0 refills | Status: DC

## 2020-03-06 MED ORDER — TIZANIDINE HCL 4 MG PO TABS: 4.0000 mg | ORAL_TABLET | Freq: Four times a day (QID) | ORAL | 0 refills | Status: DC | PRN

## 2020-03-06 NOTE — ED Triage Notes (Signed)
Pt reports picking up his little brother and had immediate severe back pain in thoracic area wrapping around sides.  No pain in legs, no numbness tingling in legs.  Reports left 4th and 5th digits are going numb but that's been going on "for a while" (before back injury today).    Describes pain as sharp and cramping.

## 2020-03-06 NOTE — ED Provider Notes (Signed)
Travis Ponce   MRN: [id number] DOB: March 31, 1991  Subjective:   Travis Ponce is a 29 y.o. male presenting for 1 day history of acute onset mid to low back thoracic pain.  Symptoms started from patient lifting of his little brother, felt immediate severe pain that wraps around his flank sides.  Patient would like a note for his work, does a lot of heavy lifting, fast-paced work.  Has not taken any medications for relief.  No current facility-administered medications for this encounter.  Current Outpatient Medications:  .  albuterol (PROVENTIL HFA;VENTOLIN HFA) 108 (90 Base) MCG/ACT inhaler, Inhale 2 puffs into the lungs every 6 (six) hours as needed for wheezing or shortness of breath., Disp: 1 Inhaler, Rfl: 2 .  ranitidine (ZANTAC) 300 MG tablet, Take 1 tablet (300 mg total) by mouth 2 (two) times daily., Disp: 60 tablet, Rfl: 0   No Known Allergies  Past Medical History:  Diagnosis Date  . Asthma    Reported history of asthma  . GERD (gastroesophageal reflux disease)      Past Surgical History:  Procedure Laterality Date  . KNEE SURGERY Left    ACL repair x 2 and meniscal repair    Family History  Problem Relation Age of Onset  . Colon cancer Mother     Social History   Tobacco Use  . Smoking status: Current Every Day Smoker    Packs/day: 0.75    Years: 15.00    Pack years: 11.25    Types: Cigarettes  . Smokeless tobacco: Never Used  Vaping Use  . Vaping Use: Never used  Substance Use Topics  . Alcohol use: Not Currently  . Drug use: Not Currently    Frequency: 7.0 times per week    Types: Marijuana    ROS   Objective:   Vitals: BP 107/68 (BP Location: Left Arm)   Pulse (!) 56   Temp 98.5 F (36.9 C) (Oral)   Resp 18   SpO2 96%   Physical Exam Constitutional:      General: He is not in acute distress.    Appearance: Normal appearance. He is well-developed and normal weight. He is not ill-appearing, toxic-appearing or  diaphoretic.  HENT:     Head: Normocephalic and atraumatic.     Right Ear: External ear normal.     Left Ear: External ear normal.     Nose: Nose normal.     Mouth/Throat:     Pharynx: Oropharynx is clear.  Eyes:     General: No scleral icterus.       Right eye: No discharge.        Left eye: No discharge.     Extraocular Movements: Extraocular movements intact.     Pupils: Pupils are equal, round, and reactive to light.  Cardiovascular:     Rate and Rhythm: Normal rate.  Pulmonary:     Effort: Pulmonary effort is normal.  Musculoskeletal:     Cervical back: Normal range of motion.     Thoracic back: Spasms and tenderness present. No swelling, edema, deformity, signs of trauma, lacerations or bony tenderness. Decreased range of motion. No scoliosis.     Lumbar back: Spasms (superior lumbar region) and tenderness (superior lumbar region) present. No swelling, edema, deformity, signs of trauma, lacerations or bony tenderness. Decreased range of motion. Negative right straight leg raise test and negative left straight leg raise test. No scoliosis.  Skin:    General: Skin is warm and dry.  Neurological:     Mental Status: He is alert and oriented to person, place, and time.     Motor: No weakness.     Coordination: Coordination normal.     Gait: Gait normal.     Deep Tendon Reflexes: Reflexes normal.  Psychiatric:        Mood and Affect: Mood normal.        Behavior: Behavior normal.        Thought Content: Thought content normal.        Judgment: Judgment normal.     Assessment and Plan :   PDMP not reviewed this encounter.  1. Acute bilateral thoracic back pain   2. Strain of thoracic back region     Will manage conservatively for back strain with NSAID and muscle relaxant, rest and modification of physical activity.  Anticipatory guidance provided.  Counseled patient on potential for adverse effects with medications prescribed/recommended today, ER and return-to-clinic  precautions discussed, patient verbalized understanding.    Wallis Bamberg, PA-C 03/07/20 1014

## 2020-07-14 ENCOUNTER — Emergency Department: Payer: Self-pay

## 2020-07-14 ENCOUNTER — Emergency Department
Admission: EM | Admit: 2020-07-14 | Discharge: 2020-07-14 | Disposition: A | Discharge status: Home / Self Care | Payer: Self-pay | Attending: Emergency Medicine | Admitting: Emergency Medicine

## 2020-07-14 ENCOUNTER — Encounter: Payer: Self-pay | Admitting: Emergency Medicine

## 2020-07-14 ENCOUNTER — Other Ambulatory Visit: Payer: Self-pay

## 2020-07-14 DIAGNOSIS — Z23 Encounter for immunization: Secondary | ICD-10-CM | POA: Insufficient documentation

## 2020-07-14 DIAGNOSIS — S61012A Laceration without foreign body of left thumb without damage to nail, initial encounter: Secondary | ICD-10-CM | POA: Insufficient documentation

## 2020-07-14 DIAGNOSIS — J45909 Unspecified asthma, uncomplicated: Secondary | ICD-10-CM | POA: Insufficient documentation

## 2020-07-14 DIAGNOSIS — F17210 Nicotine dependence, cigarettes, uncomplicated: Secondary | ICD-10-CM | POA: Insufficient documentation

## 2020-07-14 DIAGNOSIS — W25XXXA Contact with sharp glass, initial encounter: Secondary | ICD-10-CM | POA: Insufficient documentation

## 2020-07-14 MED ORDER — TETANUS-DIPHTH-ACELL PERTUSSIS 5-2.5-18.5 LF-MCG/0.5 IM SUSY
0.5000 mL | PREFILLED_SYRINGE | Freq: Once | INTRAMUSCULAR | Status: AC
  Administered 2020-07-14: 0.5 mL via INTRAMUSCULAR
  Filled 2020-07-14: qty 0.5

## 2020-07-14 MED ORDER — LIDOCAINE HCL (PF) 1 % IJ SOLN
5.0000 mL | Freq: Once | INTRAMUSCULAR | Status: AC
  Administered 2020-07-14: 5 mL
  Filled 2020-07-14: qty 5

## 2020-07-14 MED ORDER — ACETAMINOPHEN 325 MG PO TABS: 650.0000 mg | ORAL_TABLET | Freq: Once | ORAL | Status: DC

## 2020-07-14 NOTE — ED Provider Notes (Signed)
[facility] Emergency Department Provider Note  ____________________________________________   None    (approximate)  I have reviewed the triage vital signs and the nursing notes.   HISTORY  Chief Complaint Laceration and Hand Pain   HPI Travis Ponce is a 30 y.o. male presents to the ED via [place] for medical clearance.  Patient states that he has laceration to his left hand.  He is unaware of the last tetanus but is almost sure it has been over 10 years.  He was cut with a piece of glass that broke from a door.  He rates his pain as 10/10.      Past Medical History:  Diagnosis Date  . Asthma    Reported history of asthma  . GERD (gastroesophageal reflux disease)     Patient Active Problem List   Diagnosis Date Noted  . Asthma 08/29/2019  . Pre-syncope 01/25/2016    Past Surgical History:  Procedure Laterality Date  . KNEE SURGERY Left    ACL repair x 2 and meniscal repair    Prior to Admission medications   Medication Sig Start Date End Date Taking? Authorizing Provider  albuterol (PROVENTIL HFA;VENTOLIN HFA) 108 (90 Base) MCG/ACT inhaler Inhale 2 puffs into the lungs every 6 (six) hours as needed for wheezing or shortness of breath. 09/16/18   Johnn Hai, PA-C  ranitidine (ZANTAC) 300 MG tablet Take 1 tablet (300 mg total) by mouth 2 (two) times daily. 05/01/15 07/14/20  Carrie Mew, MD    Allergies Patient has no known allergies.  Family History  Problem Relation Age of Onset  . Colon cancer Mother     Social History Social History   Tobacco Use  . Smoking status: Current Every Day Smoker    Packs/day: 0.75    Years: 15.00    Pack years: 11.25    Types: Cigarettes  . Smokeless tobacco: Never Used  Vaping Use  . Vaping Use: Never used  Substance Use Topics  . Alcohol use: Yes  . Drug use: Not Currently    Frequency: 7.0 times per week    Types: Marijuana    Review of  Systems Constitutional: No fever/chills Eyes: No visual changes. ENT: No trauma. Cardiovascular: Denies chest pain. Respiratory: Denies shortness of breath. Gastrointestinal: No abdominal pain.  No nausea, no vomiting.  Musculoskeletal: Positive for left hand pain. Skin: Positive for laceration. Neurological: Negative for headaches, focal weakness or numbness.  ____________________________________________   PHYSICAL EXAM:  VITAL SIGNS: ED Triage Vitals [07/14/20 0430]  Enc Vitals Group     BP 130/77     Pulse Rate 94     Resp 18     Temp 98.7 F (37.1 C)     Temp Source Oral     SpO2 98 %     Weight 155 lb (70.3 kg)     Height 5\' 5"  (1.651 m)     Head Circumference      Peak Flow      Pain Score 10     Pain Loc      Pain Edu?      Excl. in [place]?     Constitutional: Alert and oriented. Well appearing and in no acute distress. Eyes: Conjunctivae are normal.  Head: Atraumatic. Neck: No stridor.   Cardiovascular: Normal rate, regular rhythm. Grossly normal heart sounds.  Good peripheral circulation. Respiratory: Normal respiratory effort.  No retractions. Lungs CTAB. Gastrointestinal: Soft and nontender. No distention. No  abdominal bruits. No CVA tenderness. Musculoskeletal: On examination of left hand there is no gross deformity however there is a superficial laceration noted to the base of the left thumb on the volar aspect.  No active bleeding and no obvious foreign body present.  Patient is able to flex and extend his thumb without restrictions.  Motor sensory function intact.  Capillary refills less than 3 seconds. Neurologic:  Normal speech and language. No gross focal neurologic deficits are appreciated.  Skin:  Skin is warm, dry.  Laceration as noted above. Psychiatric: Mood and affect are normal. Speech and behavior are normal.  ____________________________________________   LABS (all labs ordered are listed, but only abnormal results are displayed)  Labs  Reviewed - No data to display ____________________________________________  RADIOLOGY I, Tommi Rumps, personally viewed and evaluated these images (plain radiographs) as part of my medical decision making, as well as reviewing the written report by the radiologist.   Official radiology report(s): DG Hand Complete Left  Result Date: 07/14/2020 CLINICAL DATA:  Pain.  Hand slammed in car door. EXAM: LEFT HAND - COMPLETE 3+ VIEW COMPARISON:  None. FINDINGS: Deformity of the left little finger proximal phalanx is favored to be chronic. No other osseous abnormality of the left hand. Chronic fracture of the left ulnar styloid. IMPRESSION: Chronic appearing deformity of the left little finger proximal phalanx. No other osseous abnormality of the left hand. Electronically Signed   By: Deatra Robinson M.D.   On: 07/14/2020 05:19    ____________________________________________   PROCEDURES  Procedure(s) performed (including Critical Care):  Marland KitchenMarland KitchenLaceration Repair  Date/Time: 07/14/2020 9:38 AM Performed by: Tommi Rumps, PA-C Authorized by: Tommi Rumps, PA-C   Consent:    Consent obtained:  Verbal   Consent given by:  Patient   Risks, benefits, and alternatives were discussed: yes     Risks discussed:  Pain, infection and poor wound healing Universal protocol:    Patient identity confirmed:  Verbally with patient Anesthesia:    Anesthesia method:  Nerve block   Block needle gauge:  25 G   Block anesthetic:  Lidocaine 1% w/o epi   Block technique:  Digital   Block injection procedure:  Anatomic landmarks identified, introduced needle, incremental injection, anatomic landmarks palpated and negative aspiration for blood   Block outcome:  Anesthesia achieved Laceration details:    Location:  Finger   Finger location:  L thumb   Length (cm):  2 Pre-procedure details:    Preparation:  Patient was prepped and draped in usual sterile fashion and imaging obtained to evaluate for  foreign bodies Exploration:    Limited defect created (wound extended): yes     Hemostasis achieved with:  Direct pressure   Imaging outcome: foreign body not noted     Wound exploration: wound explored through full range of motion and entire depth of wound visualized     Contaminated: no   Treatment:    Area cleansed with:  Saline and chlorhexidine   Amount of cleaning:  Standard   Irrigation solution:  Sterile saline   Irrigation volume:  30 ml   Irrigation method:  Syringe   Visualized foreign bodies/material removed: no     Debridement:  Minimal   Undermining:  None   Scar revision: no   Skin repair:    Repair method:  Sutures   Suture size:  5-0   Suture material:  Nylon   Suture technique:  Simple interrupted   Number of sutures:  4  Approximation:    Approximation:  Close Repair type:    Repair type:  Simple Post-procedure details:    Dressing:  Non-adherent dressing   Procedure completion:  Tolerated     ____________________________________________   INITIAL IMPRESSION / ASSESSMENT AND PLAN / ED COURSE  As part of my medical decision making, I reviewed the following data within the electronic MEDICAL RECORD NUMBER Notes from prior ED visits and [place] Controlled Substance Database  30 year old male presents to the ED via [facility] for medical clearance and also evaluation and treatment of a laceration to his left hand.  Patient x-ray was negative for acute bony injury and no foreign body was noted.  On exam the laceration is superficial and without active bleeding.  Area was examined without bleeding present and no foreign body was noted.  Area was cleaned numerous times with normal saline and a syringe.  Patient tolerated procedure well and for simple interrupted sutures were placed.  Tetanus was updated.  Patient is aware that he needs to have these removed in the next 10 days.  He is to watch daily for any signs of infection and keep the area clean and dry.  He was  encouraged to take Tylenol or ibuprofen if needed for pain.  At this time he is discharged in the custody of [place] police.  ____________________________________________   FINAL CLINICAL IMPRESSION(S) / ED DIAGNOSES  Final diagnoses:  Laceration of left thumb without foreign body without damage to nail, initial encounter     ED Discharge Orders    None      *Please note:  Travis Ponce was evaluated in Emergency Department on 07/14/2020 for the symptoms described in the history of present illness. He was evaluated in the context of the global COVID-19 pandemic, which necessitated consideration that the patient might be at risk for infection with the SARS-CoV-2 virus that causes COVID-19. Institutional protocols and algorithms that pertain to the evaluation of patients at risk for COVID-19 are in a state of rapid change based on information released by regulatory bodies including the CDC and federal and state organizations. These policies and algorithms were followed during the patient's care in the ED.  Some ED evaluations and interventions may be delayed as a result of limited staffing during and the pandemic.*   Note:  This document was prepared using Dragon voice recognition software and may include unintentional dictation errors.    Tommi Rumps, PA-C 07/14/20 1440    Merwyn Katos, MD 07/14/20 [phone]

## 2020-07-14 NOTE — Discharge Instructions (Addendum)
Follow-up with your primary care provider or an urgent care to have sutures removed in 10 days.  Keep the area clean and dry.  You may clean daily with mild soap and water and allowed to dry completely before putting a bandage on it.  Watch for any signs of infection such as pus or redness.  Area will be sore and tender for several days due to injury causing the laceration.  You may take Tylenol or ibuprofen as needed for pain.

## 2020-07-14 NOTE — ED Triage Notes (Signed)
Patient brought in by [employer] for medical clearance. Patient states that he hand was slammed in a door. Patient with laceration to left hand with bleeding controlled.

## 2021-03-19 IMAGING — CR DG HAND COMPLETE 3+V*L*
3 series · 3 of 3 positions shown · non-contrast
Comparison: None.

CLINICAL DATA: Pain.  Hand slammed in car door.

EXAM:
LEFT HAND - COMPLETE 3+ VIEW

[hand ap]
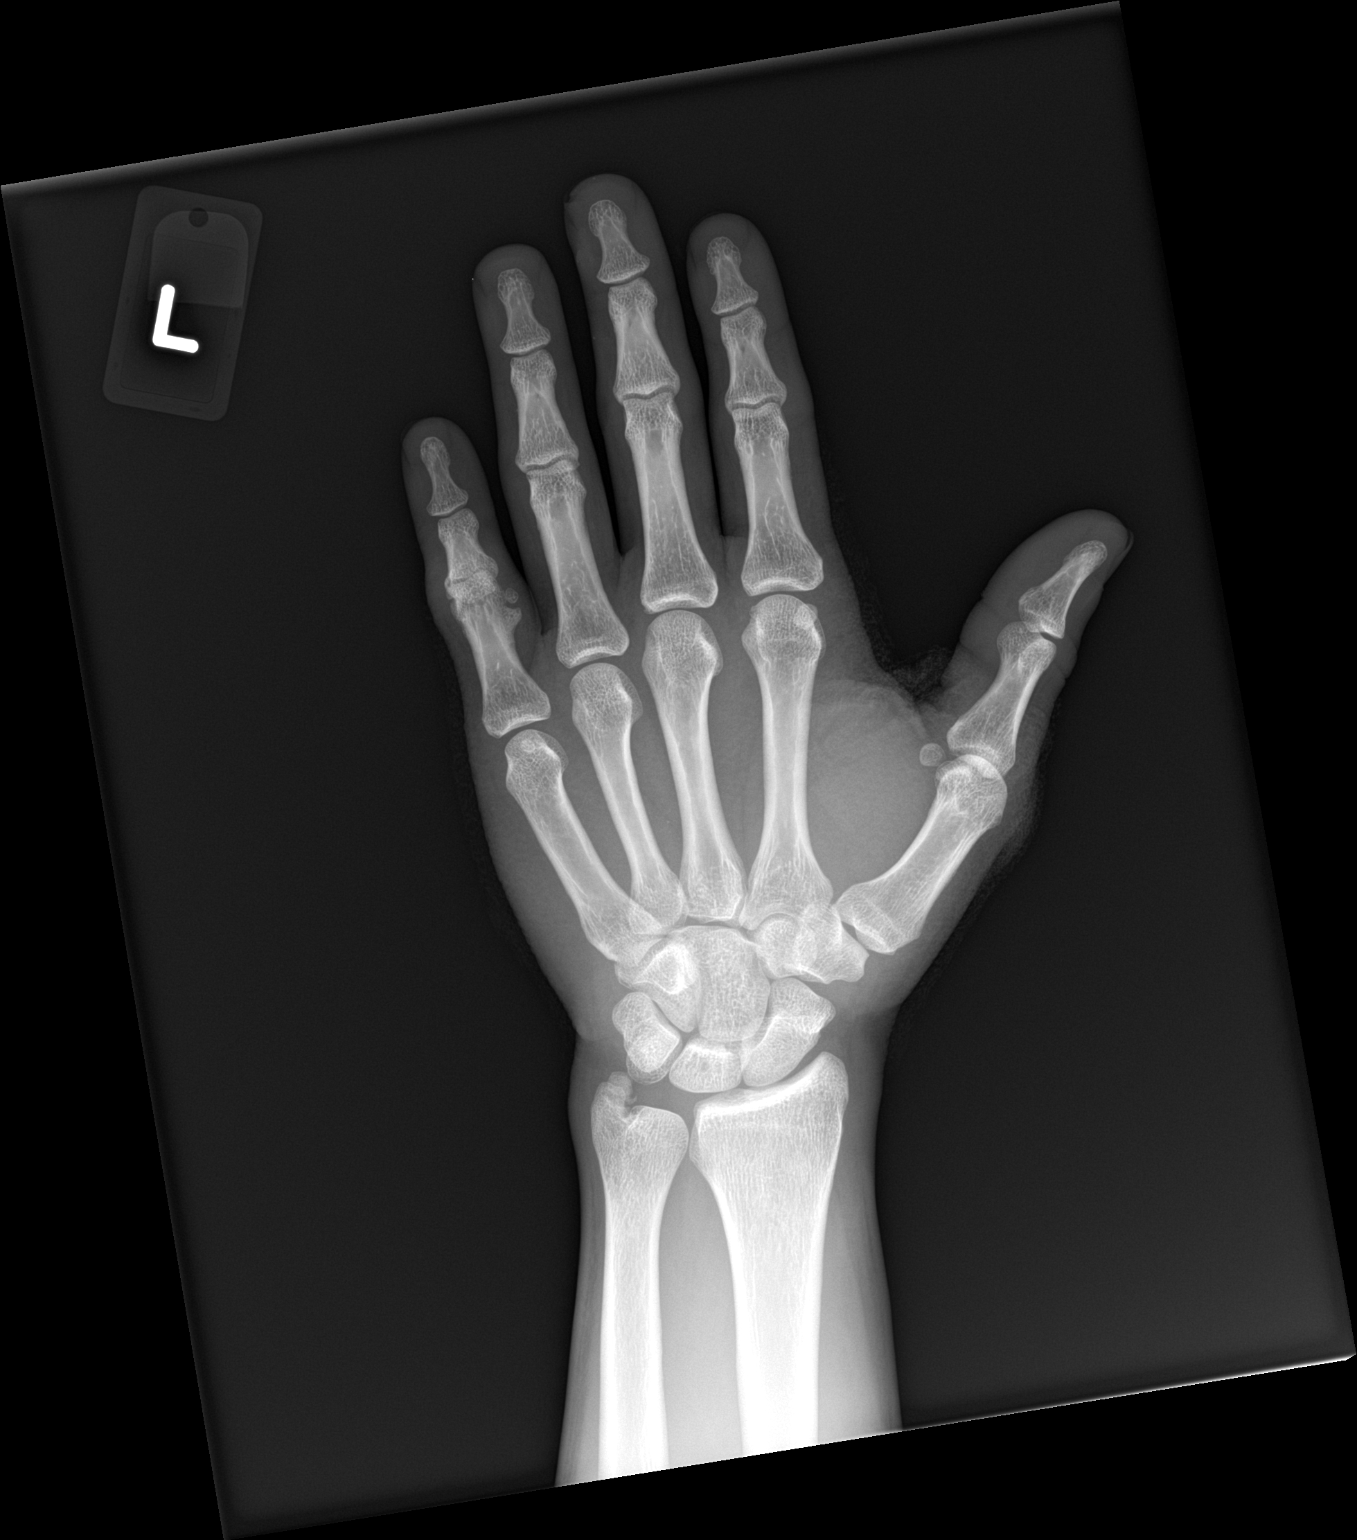

[hand obl]
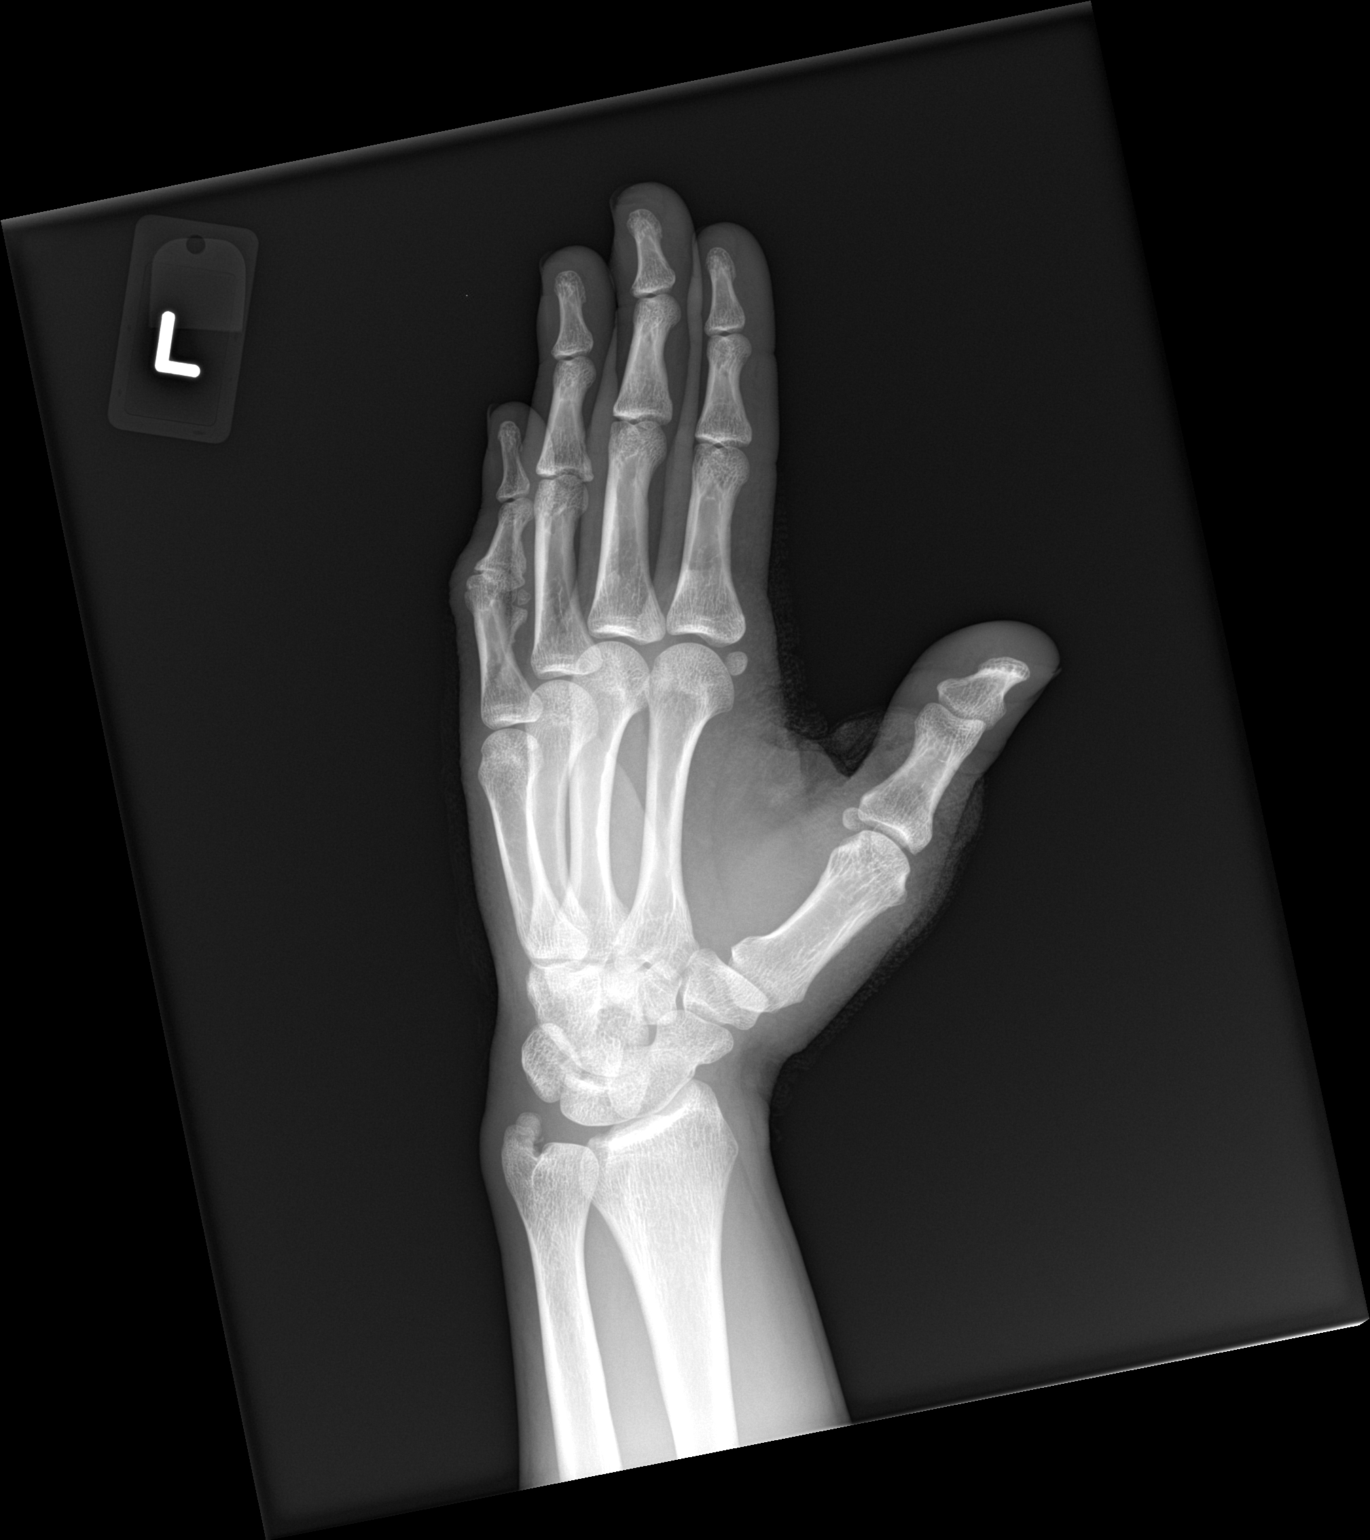

[hand lat]
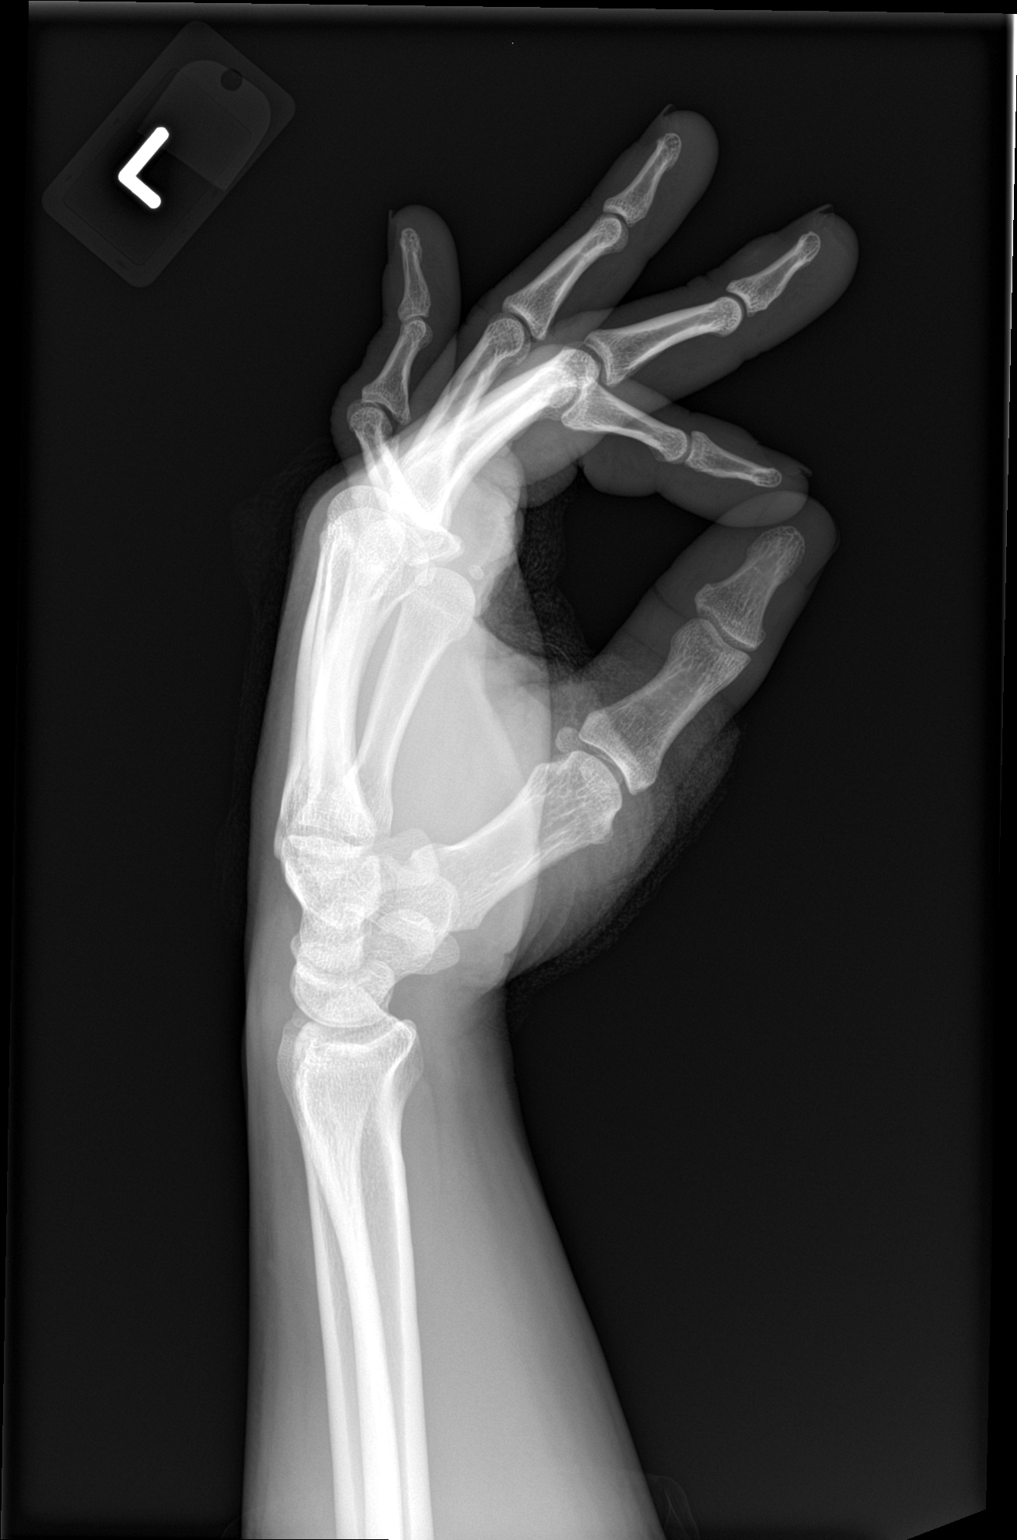

[3 of 3 positions shown; findings below may reference images not displayed]

FINDINGS: Deformity of the left little finger proximal phalanx is favored to
be chronic. No other osseous abnormality of the left hand. Chronic
fracture of the left ulnar styloid.
IMPRESSION: Chronic appearing deformity of the left little finger proximal
phalanx. No other osseous abnormality of the left hand.

## 2024-05-10 ENCOUNTER — Ambulatory Visit: Payer: Self-pay | Admitting: Nurse Practitioner

## 2024-05-10 ENCOUNTER — Encounter: Payer: Self-pay | Admitting: Nurse Practitioner

## 2024-05-10 DIAGNOSIS — Z113 Encounter for screening for infections with a predominantly sexual mode of transmission: Secondary | ICD-10-CM

## 2024-05-10 LAB — HM HIV SCREENING LAB: HM HIV Screening: NEGATIVE

## 2024-05-10 LAB — HM HEPATITIS C SCREENING LAB: HM Hepatitis Screen: NEGATIVE

## 2024-05-10 NOTE — Progress Notes (Signed)
 [facility] Department STI clinic [address] [place] [address] Main phone: [phone]  STI screening visit  Subjective:  Travis Ponce is a 33 y.o. male being seen today for an STI screening visit. The patient reports they do not have symptoms.    Patient has the following medical conditions:  Patient Active Problem List   Diagnosis Date Noted   Asthma 08/29/2019   Pre-syncope 01/25/2016   Chief Complaint  Patient presents with   SEXUALLY TRANSMITTED DISEASE    STD screening no symptoms    HPI Patient reports 2 male partners in the last 2 months with no condom use. He denies symptoms.  See flowsheet for further details and programmatic requirements  Hyperlink available at the top of the signed note in blue.  Flow sheet content below:  Pregnancy Intention Screening Does the patient want to become pregnant in the next year?: N/A Does the patient's partner want to become pregnant in the next year?: No Would the patient like to discuss contraceptive options today?: N/A Reason For STD Screen STD Screening: Is asymptomatic Have you ever had an STD?: Yes Risk Factors for Hep B History of Incarceration: Yes Abuse History Has patient ever been abused physically?: No Has patient ever been abused sexually?: No Does patient feel they have a problem with Anxiety?: No Does patient feel they have a problem with Depression?: No  Screening for MPX risk:  Unexplained rash?  No   MSM?  No   Multiple or anonymous sex partners?  No   Any close or sexual contact with a person  diagnosed with MPX?  No   Any outside the [place]  where MPX is endemic?  No   High clinical suspicion for MPX?    -Unlikely to be chickenpox    -Lymphadenopathy    -Rash that presents in same phase of       evolution on any given body part  No   STI screening history: Last HIV test per patient/review of record was  Lab Results  Component Value Date    HMHIVSCREEN Negative - Validated 09/02/2019   No results found for: HIV  Last HEPC test per patient/review of record was  Lab Results  Component Value Date   HMHEPCSCREEN Negative-Validated 09/02/2019   No components found for: HEPC   Last HEPB test per patient/review of record was No components found for: HMHEPBSCREEN   Fertility: Does the patient or their partner desires a pregnancy in the next year? No  Immunization History  Administered Date(s) Administered   Tdap 07/14/2020    The following portions of the patient's history were reviewed and updated as appropriate: allergies, current medications, past medical history, past social history, past surgical history and problem list.  Objective:  There were no vitals filed for this visit.  Physical Exam Vitals and nursing note reviewed.  Constitutional:      Appearance: Normal appearance.  HENT:     Head: Normocephalic and atraumatic.     Mouth/Throat:     Mouth: Mucous membranes are moist.     Pharynx: No oropharyngeal exudate or posterior oropharyngeal erythema.  Eyes:     General:        Right eye: No discharge.        Left eye: No discharge.     Conjunctiva/sclera:     Right eye: Right conjunctiva is not injected. No exudate.    Left eye: Left conjunctiva is not injected. No exudate. Pulmonary:  Effort: Pulmonary effort is normal.  Abdominal:     General: Abdomen is flat.     Palpations: Abdomen is soft. There is no hepatomegaly or mass.     Tenderness: There is no abdominal tenderness. There is no rebound.  Genitourinary:    Comments: Declined genital exam- asymptomatic Lymphadenopathy:     Cervical: No cervical adenopathy.     Upper Body:     Right upper body: No supraclavicular or axillary adenopathy.     Left upper body: No supraclavicular or axillary adenopathy.  Skin:    General: Skin is warm and dry.  Neurological:     Mental Status: He is alert and oriented to person, place, and time.   Psychiatric:        Mood and Affect: Mood normal.        Behavior: Behavior normal.      Assessment and Plan:  Travis Ponce is a 33 y.o. male presenting to the [facility] Department for STI screening  1. Screening for venereal disease (Primary)  - Syphilis Serology, [place] [facility] - Chlamydia/GC NAA, Confirmation - HIV/HCV [place] [facility] - Gonococcus culture - HBV Antigen/Antibody [facility]   Patient does not have STI symptoms Patient accepted the following screenings: oral GC culture, vaginal CT/GC swab, vaginal wet prep, HIV, RPR, Hep B, and Hep C Patient meets criteria for HepB screening? Yes. Ordered? yes Patient meets criteria for HepC screening? Yes. Ordered? yes Recommended condom use with all sex Discussed importance of condom use for STI prevention  Treat positive test results per standing order. Discussed time line for [facility] results and that patient will be called with positive results and encouraged patient to call if he had not heard in 2 weeks Recommended repeat testing in 3 months with positive results. Recommended returning for continued or worsening symptoms.   No follow-ups on file.  No future appointments.  Brandi Tomlinson K Jovita Persing, NP

## 2024-05-10 NOTE — Progress Notes (Signed)
 Pt is here STD screening. Condoms declined. Austine Lefort, RN.

## 2024-05-13 LAB — CHLAMYDIA/GC NAA, CONFIRMATION
Chlamydia trachomatis, NAA: NEGATIVE
Neisseria gonorrhoeae, NAA: NEGATIVE

## 2024-05-13 LAB — HBV ANTIGEN/ANTIBODY STATE LAB
Hep B Core Total Ab: NONREACTIVE
Hep B S Ab: REACTIVE
Hepatitis B Surface Ag: NONREACTIVE

## 2024-05-15 LAB — GONOCOCCUS CULTURE
